# Patient Record
Sex: Male | Born: 1980 | Race: Black or African American | Hispanic: No | Marital: Single | Smoking: Current some day smoker
Health system: Southern US, Community
[De-identification: ages and names within clinical notes are randomized; demographics above are authoritative.]

---

## 2005-12-08 ENCOUNTER — Emergency Department (HOSPITAL_COMMUNITY): Admission: EM | Admit: 2005-12-08 | Discharge: 2005-12-08 | Payer: Self-pay | Admitting: Emergency Medicine

## 2007-04-20 ENCOUNTER — Emergency Department (HOSPITAL_COMMUNITY): Admission: EM | Admit: 2007-04-20 | Discharge: 2007-04-20 | Payer: Self-pay | Admitting: Emergency Medicine

## 2007-04-29 ENCOUNTER — Emergency Department (HOSPITAL_COMMUNITY): Admission: EM | Admit: 2007-04-29 | Discharge: 2007-04-29 | Payer: Self-pay | Admitting: Emergency Medicine

## 2009-05-29 ENCOUNTER — Emergency Department (HOSPITAL_COMMUNITY): Admission: EM | Admit: 2009-05-29 | Discharge: 2009-05-29 | Payer: Self-pay | Admitting: Emergency Medicine

## 2015-06-11 ENCOUNTER — Encounter (HOSPITAL_COMMUNITY): Payer: Self-pay | Admitting: *Deleted

## 2015-06-11 ENCOUNTER — Emergency Department (HOSPITAL_COMMUNITY)
Admission: EM | Admit: 2015-06-11 | Discharge: 2015-06-11 | Disposition: A | Discharge status: Home / Self Care | Payer: BLUE CROSS/BLUE SHIELD | Attending: Emergency Medicine | Admitting: Emergency Medicine

## 2015-06-11 ENCOUNTER — Emergency Department (HOSPITAL_COMMUNITY): Payer: BLUE CROSS/BLUE SHIELD

## 2015-06-11 DIAGNOSIS — R519 Headache, unspecified: Secondary | ICD-10-CM

## 2015-06-11 DIAGNOSIS — H538 Other visual disturbances: Secondary | ICD-10-CM | POA: Diagnosis not present

## 2015-06-11 DIAGNOSIS — R51 Headache: Secondary | ICD-10-CM | POA: Diagnosis not present

## 2015-06-11 DIAGNOSIS — F17200 Nicotine dependence, unspecified, uncomplicated: Secondary | ICD-10-CM | POA: Diagnosis not present

## 2015-06-11 MED ORDER — DEXAMETHASONE SODIUM PHOSPHATE 10 MG/ML IJ SOLN
10.0000 mg | Freq: Once | INTRAMUSCULAR | Status: AC
  Administered 2015-06-11: 10 mg via INTRAVENOUS
  Filled 2015-06-11: qty 1

## 2015-06-11 MED ORDER — DIPHENHYDRAMINE HCL 50 MG/ML IJ SOLN
25.0000 mg | Freq: Once | INTRAMUSCULAR | Status: AC
  Administered 2015-06-11: 25 mg via INTRAVENOUS
  Filled 2015-06-11: qty 1

## 2015-06-11 MED ORDER — PREDNISONE 20 MG PO TABS: ORAL_TABLET | ORAL | Status: DC

## 2015-06-11 MED ORDER — METOCLOPRAMIDE HCL 5 MG/ML IJ SOLN
10.0000 mg | Freq: Once | INTRAMUSCULAR | Status: AC
  Administered 2015-06-11: 10 mg via INTRAVENOUS
  Filled 2015-06-11: qty 2

## 2015-06-11 MED ORDER — SODIUM CHLORIDE 0.9 % IV BOLUS (SEPSIS)
1000.0000 mL | Freq: Once | INTRAVENOUS | Status: AC
  Administered 2015-06-11: 1000 mL via INTRAVENOUS

## 2015-06-11 NOTE — Discharge Instructions (Signed)
Use ice packs over the area for comfort. Take the prednisone until gone. Keep your appointment with Dr Karilyn CotaGosrani on the 17th. He may want to do other studies to evaluate your headache. Return to the ED if you get a constant headache that is lasting for over an hour, or if you get numbness or tingling in your extremities, vomiting.

## 2015-06-11 NOTE — ED Notes (Signed)
Pt given ice pack,

## 2015-06-11 NOTE — ED Notes (Signed)
Pt updated, states that the headache is " a little better"

## 2015-06-11 NOTE — ED Notes (Signed)
Dr Knapp at bedside,  

## 2015-06-11 NOTE — ED Notes (Signed)
Pt updated on delay,  

## 2015-06-11 NOTE — ED Notes (Signed)
Pt c/o intermittent headache located at left temporal region that lasts about 5 seconds each time and when the headache goes away his vision will be blurry for about 5 seconds as well then return normal,

## 2015-06-11 NOTE — ED Provider Notes (Signed)
CSN: [id number]     Arrival date & time 06/11/15  0058 History   First MD Initiated Contact with Patient 06/11/15 0258   Chief Complaint  Patient presents with  . Headache     (Consider location/radiation/quality/duration/timing/severity/associated sxs/prior Treatment) HPI patient states about 4 days ago he started getting a pain in his left temple that he describes as sharp and pressure and afterwards he has blurred vision in his left eye in his left eye tears. He states the pain only last about 5 seconds. He states he was only getting it about once a day however today he's had about 10-12 episodes and it got worse after he went to work. He states nothing he does makes it come on or makes it worse, nothing he does makes it go away. He states he's even had episodes while waiting in the ED to be seen. He denies any numbness or tingling in his extremities. He states he's never had this before. He does describe he had a tick on his right buttock about 2 days before the symptoms started. He states his mother had an intracranial aneurysm, and his sister has seizures and has headaches after her seizures otherwise family history is negative.   PCP Dr Karilyn Cota  First appt on May 17th  History reviewed. No pertinent past medical history. History reviewed. No pertinent past surgical history. No family history on file. Social History  Substance Use Topics  . Smoking status: Current Some Day Smoker  . Smokeless tobacco: None  . Alcohol Use: No  employed Smokes 1/2 ppd  Review of Systems  All other systems reviewed and are negative.     Allergies  Review of patient's allergies indicates no known allergies.  Home Medications   Prior to Admission medications   Medication Sig Start Date End Date Taking? Authorizing Provider  predniSONE (DELTASONE) 20 MG tablet Take 3 po QD x 3d , then 2 po QD x 3d then 1 po QD x 3d 06/11/15   Devoria Albe, MD   BP 126/81 mmHg  Pulse 77  Temp(Src) 97.9 F (36.6  C) (Oral)  Resp 20  Ht  (1.854 m)  Wt 250 lb (113.399 kg)  BMI 32.99 kg/m2  SpO2 99%  Vital signs normal   Physical Exam  Constitutional: He is oriented to person, place, and time. He appears well-developed and well-nourished.  Non-toxic appearance. He does not appear ill. No distress.  HENT:  Head: Normocephalic and atraumatic.  Right Ear: External ear normal.  Left Ear: External ear normal.  Nose: Nose normal. No mucosal edema or rhinorrhea.  Mouth/Throat: Oropharynx is clear and moist and mucous membranes are normal. No dental abscesses or uvula swelling.  No tenderness over the temporal artery.  Eyes: Conjunctivae and EOM are normal. Pupils are equal, round, and reactive to light.  Neck: Normal range of motion and full passive range of motion without pain. Neck supple.  Cardiovascular: Normal rate, regular rhythm and normal heart sounds.  Exam reveals no gallop and no friction rub.   No murmur heard. Pulmonary/Chest: Effort normal and breath sounds normal. No respiratory distress. He has no wheezes. He has no rhonchi. He has no rales. He exhibits no tenderness and no crepitus.  Abdominal: Soft. Normal appearance and bowel sounds are normal. He exhibits no distension. There is no tenderness. There is no rebound and no guarding.  Musculoskeletal: Normal range of motion. He exhibits no edema or tenderness.  Moves all extremities well.   Neurological: He  is alert and oriented to person, place, and time. He has normal strength. No cranial nerve deficit.  Skin: Skin is warm, dry and intact. No rash noted. No erythema. No pallor.  Psychiatric: He has a normal mood and affect. His speech is normal and behavior is normal. His mood appears not anxious.  Nursing note and vitals reviewed.   ED Course  Procedures (including critical care time)  Medications  sodium chloride 0.9 % bolus 1,000 mL (0 mLs Intravenous Stopped 06/11/15 0512)  metoCLOPramide (REGLAN) injection 10 mg (10 mg  Intravenous Given 06/11/15 0327)  diphenhydrAMINE (BENADRYL) injection 25 mg (25 mg Intravenous Given 06/11/15 0327)  dexamethasone (DECADRON) injection 10 mg (10 mg Intravenous Given 06/11/15 0515)    Patient was given IV Reglan and Benadryl for his atypical headache pain.  Patient was rechecked at 4:30. He states he had been sleeping. He does not think he's had anymore episodes. I told him would watch a little bit longer and make sure they're gone. He was given the results of his CT scan. We discussed he needs to discuss these headaches with his primary care Dr. Kathrynn RunningManning has his first appointment on the 17th. They may want to get a MRI to pursue the history of an aneurysm in his mother. Unfortunately there is no MRI available here on night shift or for the weekend.  Recheck at 5 AM he states he had another episode right after I left the room. He was given IV Decadron.   5:40 AM patient states he had another episode. He had ice pack placed on his temple.  Patient rechecked at 6:15, he has had no other episodes. This point he was felt to be stable for discharge. He has an appointment with his PCP for the first time in a couple days. He could have him decide if he needs any more testing done if he continues to have symptoms.  Imaging Review Ct Head Wo Contrast  06/11/2015  CLINICAL DATA:  Intermittent brief episodes of LEFT temporal headache and vision changes. Family history of aneurysm. EXAM: CT HEAD WITHOUT CONTRAST TECHNIQUE: Contiguous axial images were obtained from the base of the skull through the vertex without intravenous contrast. COMPARISON:  None. FINDINGS: INTRACRANIAL CONTENTS: The ventricles and sulci are normal. No intraparenchymal hemorrhage, mass effect nor midline shift. No acute large vascular territory infarcts. No abnormal extra-axial fluid collections. Basal cisterns are patent. ORBITS: The included ocular globes and orbital contents are normal. SINUSES: Mild paranasal sinus  mucosal thickening. The mastoid air cells are well aerated. SKULL/SOFT TISSUES: No skull fracture. No significant soft tissue swelling. IMPRESSION: Normal CT HEAD. Electronically Signed   By: Awilda Metroourtnay  Bloomer M.D.   On: 06/11/2015 04:08   I have personally reviewed and evaluated these images and lab results as part of my medical decision-making.    MDM   patient presents with an atypical headache that is in his left temple with some watering of his left eye, it may be a type of cluster headache, doubtful for temporal arteritis because of his age and his temporal artery is not tender to palpation. He has a family history of intracranial aneurysm in his mother, CT of the head without contrast was normal. His symptoms improved with ice to the area. He is to follow-up with his primary care physician this week. He is to return to the ED if he gets a constant pain that lasts more than the 5 seconds he's been having now or if he gets numbness  and tingling of his extremities or uncontrolled vomiting.    Final diagnoses:  Left temporal headache    meds OTC ibuprofen or naproxen  Plan discharge  Devoria Albe, MD, Concha Pyo, MD 06/11/15 [phone]

## 2015-06-15 ENCOUNTER — Other Ambulatory Visit (HOSPITAL_COMMUNITY): Payer: Self-pay | Admitting: Internal Medicine

## 2015-06-15 DIAGNOSIS — R519 Headache, unspecified: Secondary | ICD-10-CM

## 2015-06-15 DIAGNOSIS — R51 Headache: Principal | ICD-10-CM

## 2015-06-17 ENCOUNTER — Ambulatory Visit (HOSPITAL_COMMUNITY)
Admission: RE | Admit: 2015-06-17 | Discharge: 2015-06-17 | Disposition: A | Discharge status: Home / Self Care | Payer: BLUE CROSS/BLUE SHIELD | Source: Ambulatory Visit | Attending: Radiology | Admitting: Radiology

## 2015-06-17 ENCOUNTER — Ambulatory Visit (HOSPITAL_COMMUNITY)
Admission: RE | Admit: 2015-06-17 | Discharge: 2015-06-17 | Disposition: A | Discharge status: Home / Self Care | Payer: BLUE CROSS/BLUE SHIELD | Source: Ambulatory Visit | Attending: Internal Medicine | Admitting: Internal Medicine

## 2015-06-17 ENCOUNTER — Other Ambulatory Visit (HOSPITAL_COMMUNITY): Payer: Self-pay | Admitting: Radiology

## 2015-06-17 DIAGNOSIS — G441 Vascular headache, not elsewhere classified: Secondary | ICD-10-CM | POA: Insufficient documentation

## 2015-06-17 DIAGNOSIS — J320 Chronic maxillary sinusitis: Secondary | ICD-10-CM | POA: Insufficient documentation

## 2015-06-17 DIAGNOSIS — R51 Headache: Secondary | ICD-10-CM

## 2015-06-17 DIAGNOSIS — M795 Residual foreign body in soft tissue: Secondary | ICD-10-CM

## 2015-06-17 DIAGNOSIS — R519 Headache, unspecified: Secondary | ICD-10-CM

## 2017-03-10 IMAGING — MR MR HEAD W/O CM
6 of 10 series · 21 of 48 positions shown · non-contrast
Comparison: None.

CLINICAL DATA: Left-sided headache for 1 week. Unspecified headache
type.

EXAM:
MRI HEAD WITHOUT CONTRAST
TECHNIQUE: Multiplanar, multiecho pulse sequences of the brain and surrounding
structures were obtained without intravenous contrast.

[Series 2: T1 · sagittal · 5.0mm · 0.41mm/px · 2 of 21 slices shown (1 of 2)]
[im 1/21]
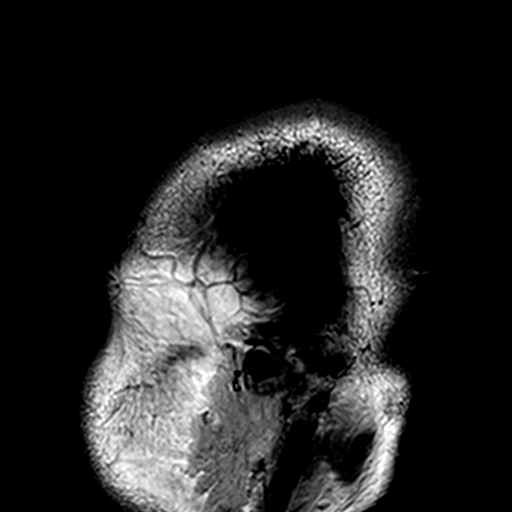
[im 21/21]
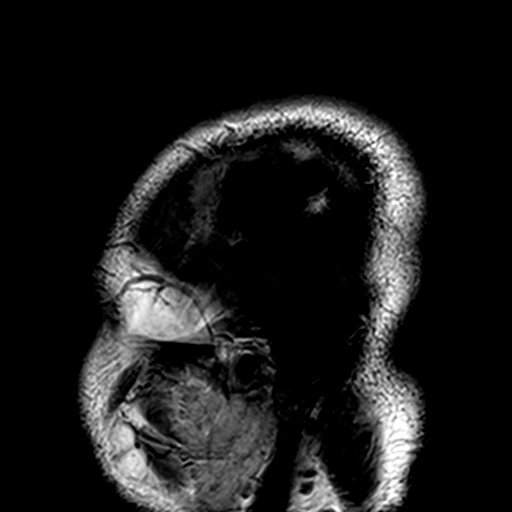

[Series 5: T2 · axial · 5.0mm · 0.49mm/px · z∈[-33,+117]mm · 3 of 24 slices shown (1 of 2)]
[im 1/24]
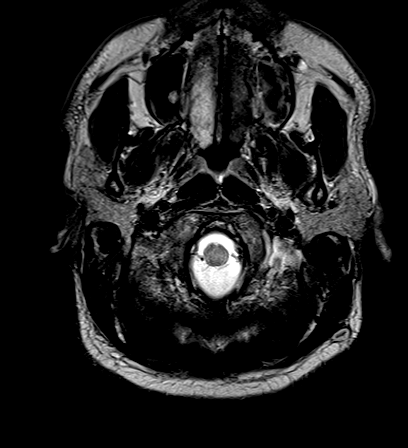
[im 12/24]
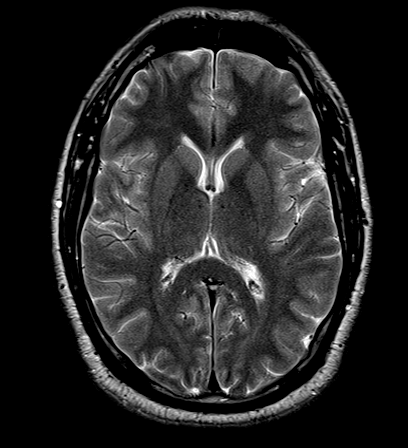
[im 24/24]
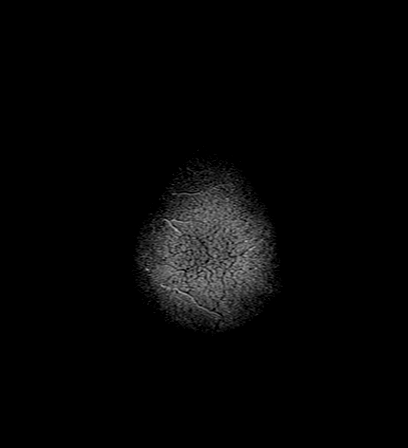

[Series 6: FLAIR · axial · 5.0mm · 0.35mm/px · z∈[-32,+117]mm · 3 of 24 slices shown]
[im 1/24]
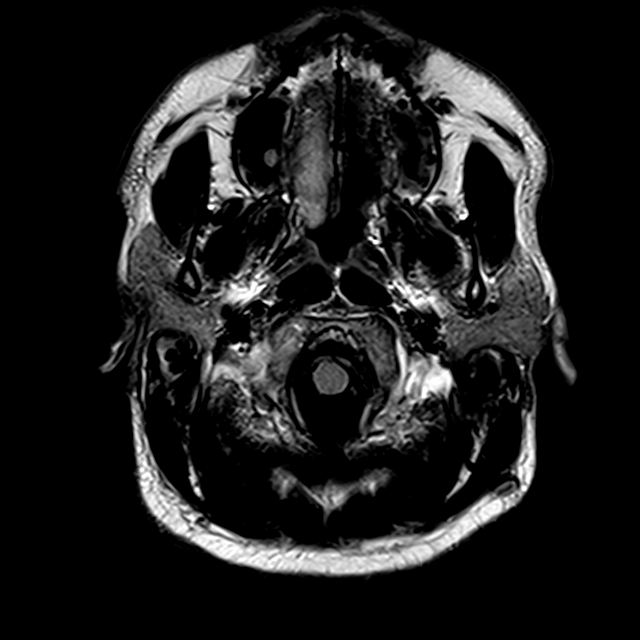
[im 12/24]
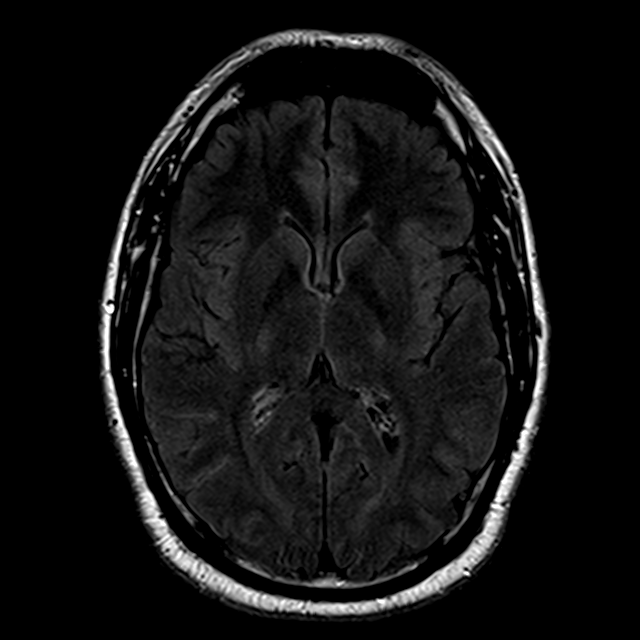
[im 24/24]
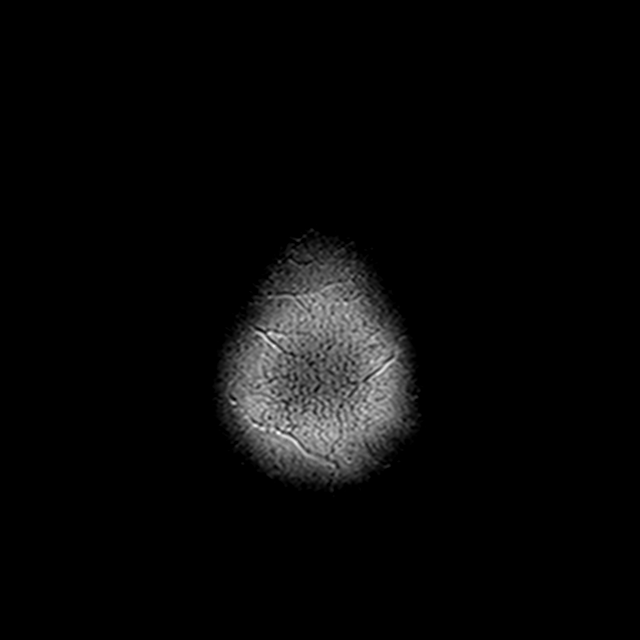

[Series 7: T1 · axial · 2.0mm · 0.43mm/px · z∈[-43,+123]mm · 8 of 84 slices shown (2 of 2)]
[im 1/84]
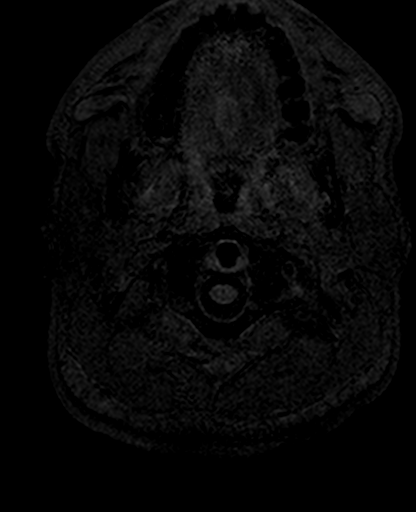
[im 10/84]
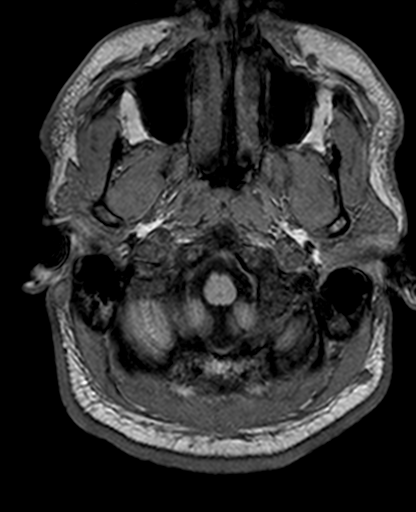
[im 28/84]
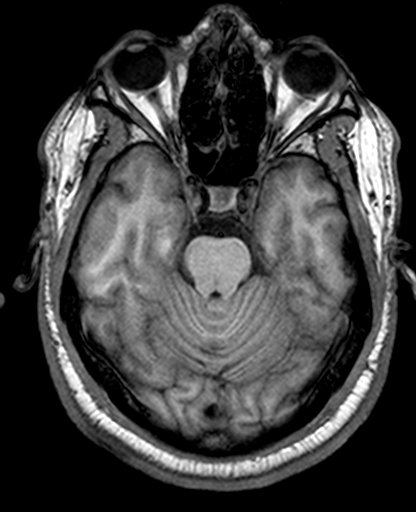
[im 37/84]
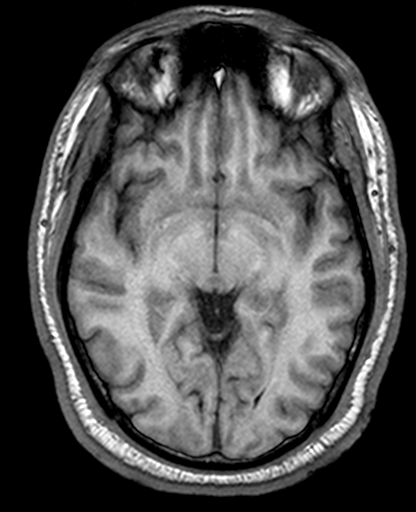
[im 47/84]
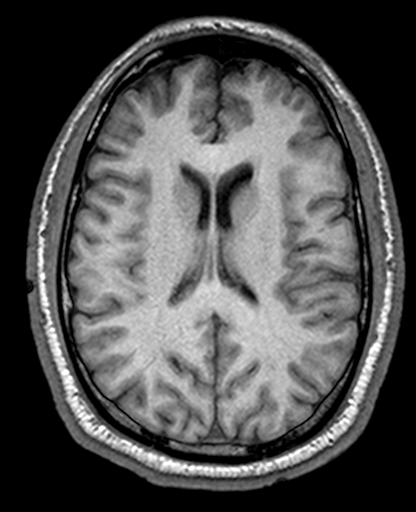
[im 56/84]
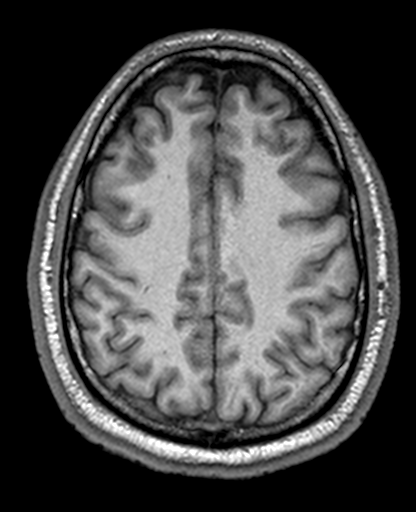
[im 74/84]
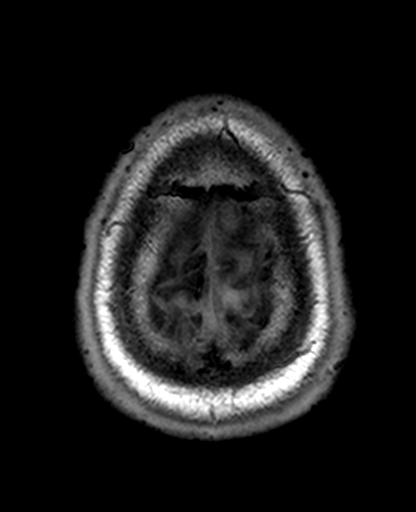
[im 84/84]
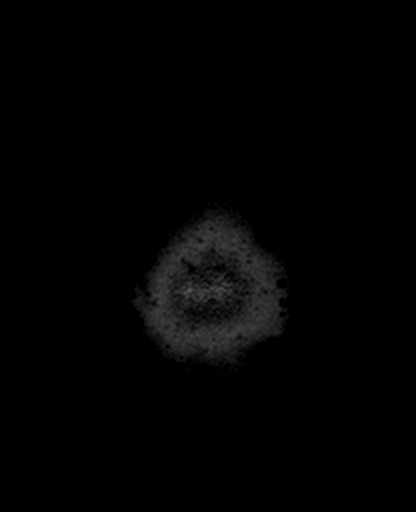

[Series 8: trauma axial · axial · 5.0mm · 0.42mm/px · z∈[-33,+39]mm · 2 of 24 slices shown]
[im 1/24]
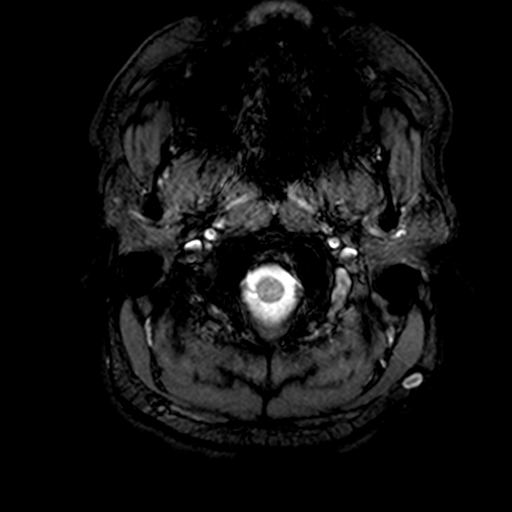
[im 12/24]
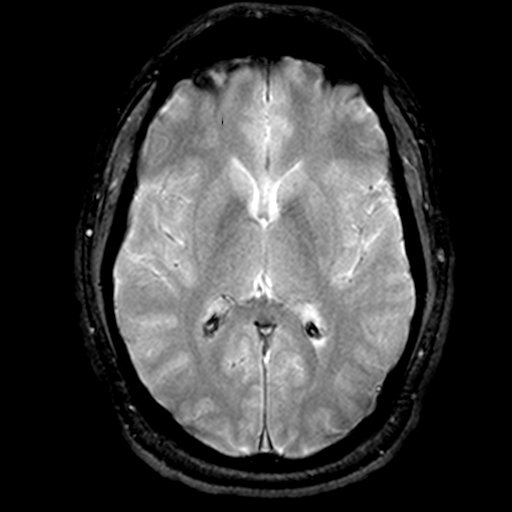

[Series 9: T2 · coronal · 5.0mm · 0.42mm/px · 3 of 26 slices shown (2 of 2)]
[im 1/26]
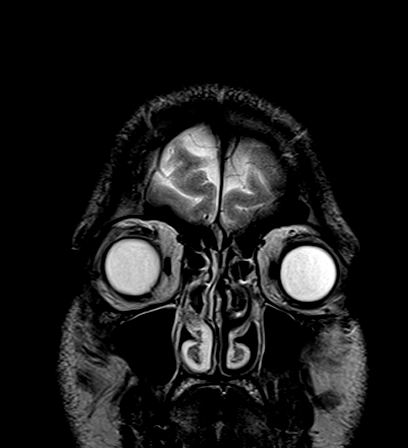
[im 13/26]
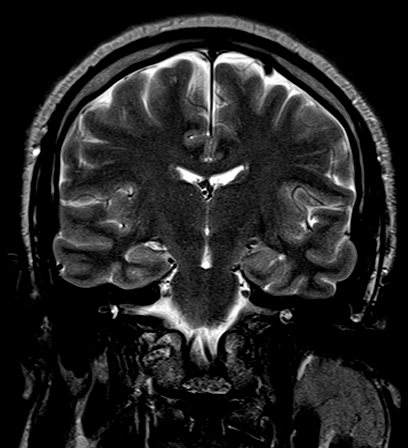
[im 26/26]
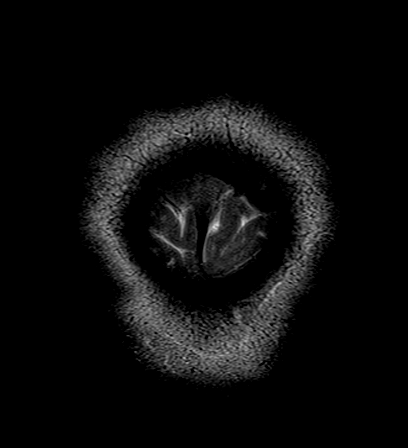

[21 of 48 positions shown; findings below may reference images not displayed]

FINDINGS: Diffusion-weighted images demonstrate no evidence for acute or
subacute infarction. No acute hemorrhage or mass lesion is present.
No significant white matter disease is present.

The ventricles are of normal size. No significant extraaxial fluid
collection is present.

The internal auditory canals are within normal limits bilaterally.
Flow is present in the major intracranial arteries. The globes and
orbits are intact.

The skullbase is within normal limits. Midline sagittal images are
unremarkable.

Mild mucosal thickening is evident in the maxillary sinuses, worse
on the left. There is mucosal thickening through the ostiomeatal
complex. No fluid levels are present. The globes and orbits are
intact.
IMPRESSION: 1. Normal MRI appearance of the brain.
2. Mild maxillary sinus disease.

## 2019-04-12 ENCOUNTER — Encounter (HOSPITAL_COMMUNITY): Payer: Self-pay

## 2019-04-12 ENCOUNTER — Other Ambulatory Visit: Payer: Self-pay

## 2019-04-12 ENCOUNTER — Emergency Department (HOSPITAL_COMMUNITY)
Admission: EM | Admit: 2019-04-12 | Discharge: 2019-04-12 | Disposition: A | Discharge status: Home / Self Care | Payer: BC Managed Care – PPO | Attending: Emergency Medicine | Admitting: Emergency Medicine

## 2019-04-12 DIAGNOSIS — F17200 Nicotine dependence, unspecified, uncomplicated: Secondary | ICD-10-CM | POA: Insufficient documentation

## 2019-04-12 DIAGNOSIS — R1013 Epigastric pain: Secondary | ICD-10-CM | POA: Diagnosis present

## 2019-04-12 DIAGNOSIS — R109 Unspecified abdominal pain: Secondary | ICD-10-CM

## 2019-04-12 LAB — COMPREHENSIVE METABOLIC PANEL
ALT: 34 U/L (ref 0–44)
AST: 25 U/L (ref 15–41)
Albumin: 4.3 g/dL (ref 3.5–5.0)
Alkaline Phosphatase: 87 U/L (ref 38–126)
Anion gap: 7 (ref 5–15)
BUN: 9 mg/dL (ref 6–20)
CO2: 25 mmol/L (ref 22–32)
Calcium: 9.5 mg/dL (ref 8.9–10.3)
Chloride: 106 mmol/L (ref 98–111)
Creatinine, Ser: 1.16 mg/dL (ref 0.61–1.24)
GFR calc Af Amer: >60 mL/min (ref >60)
GFR calc non Af Amer: >60 mL/min (ref >60)
Glucose, Bld: 98 mg/dL (ref 70–99)
Potassium: 4.4 mmol/L (ref 3.5–5.1)
Sodium: 138 mmol/L (ref 135–145)
Total Bilirubin: 0.6 mg/dL (ref 0.3–1.2)
Total Protein: 6.9 g/dL (ref 6.5–8.1)

## 2019-04-12 LAB — CBC WITH DIFFERENTIAL/PLATELET
Abs Immature Granulocytes: 0.02 10*3/uL (ref 0.00–0.07)
Basophils Absolute: 0 10*3/uL (ref 0.0–0.1)
Basophils Relative: 0 %
Eosinophils Absolute: 0.1 10*3/uL (ref 0.0–0.5)
Eosinophils Relative: 2 %
HCT: 46.5 % (ref 39.0–52.0)
Hemoglobin: 15.5 g/dL (ref 13.0–17.0)
Immature Granulocytes: 0 %
Lymphocytes Relative: 28 %
Lymphs Abs: 2.1 10*3/uL (ref 0.7–4.0)
MCH: 31 pg (ref 26.0–34.0)
MCHC: 33.3 g/dL (ref 30.0–36.0)
MCV: 93 fL (ref 80.0–100.0)
Monocytes Absolute: 0.5 10*3/uL (ref 0.1–1.0)
Monocytes Relative: 6 %
Neutro Abs: 4.7 10*3/uL (ref 1.7–7.7)
Neutrophils Relative %: 64 %
Platelets: 278 10*3/uL (ref 150–400)
RBC: 5 MIL/uL (ref 4.22–5.81)
RDW: 13.3 % (ref 11.5–15.5)
WBC: 7.5 10*3/uL (ref 4.0–10.5)
nRBC: 0 % (ref 0.0–0.2)

## 2019-04-12 LAB — LIPASE, BLOOD: Lipase: 25 U/L (ref 11–51)

## 2019-04-12 MED ORDER — ALUM & MAG HYDROXIDE-SIMETH 200-200-20 MG/5ML PO SUSP
30.0000 mL | Freq: Once | ORAL | Status: AC
  Administered 2019-04-12: 30 mL via ORAL
  Filled 2019-04-12: qty 30

## 2019-04-12 MED ORDER — OMEPRAZOLE 20 MG PO CPDR: 40.0000 mg | DELAYED_RELEASE_CAPSULE | Freq: Every day | ORAL | 0 refills | Status: DC

## 2019-04-12 MED ORDER — LOPERAMIDE HCL 2 MG PO CAPS: 2.0000 mg | ORAL_CAPSULE | Freq: Four times a day (QID) | ORAL | 0 refills | Status: DC | PRN

## 2019-04-12 NOTE — ED Notes (Signed)
Paul Floyd mother to be updated at [phone].

## 2019-04-12 NOTE — Discharge Instructions (Addendum)
You need to get established with a primary care provider for ongoing evaluation and management of her health and wellbeing.  I have also put in a referral for you to contact gastroenterology should your symptoms fail to improve with conservative therapy.  Please take your omeprazole, as prescribed.  Please take Imodium as needed for loose stools.  I also recommend over-the-counter Gas-X as well as Maalox.  Please be sure to drink plenty of fluids to avoid dehydration.  Return to the ED or seek immediate medical attention for any new or worsening symptoms.

## 2019-04-12 NOTE — ED Provider Notes (Addendum)
[facility] EMERGENCY DEPARTMENT Provider Note   CSN: [id number] Arrival date & time: 04/12/19  1318     History Chief Complaint  Patient presents with  . Gastroesophageal Reflux    Paul Floyd is a 39 y.o. male with no significant PMH who presents the ED with a 6-hour history of constant, unrelenting, intermittently 10 out of 10 epigastric abdominal pain.  Patient reports that he has had the symptoms in the past and that they typically resolve with antacids.  However, he is consumed Pepto-Bismol and Tums, with no improvement.  He describes the pain as a "twisting" pain in epigastrium.  Does not radiate to the back or elsewhere.  He also endorses diminished appetite and has not been eating or drinking today.  Denies any fevers or chills, chest pain palpitations, shortness of breath, cough, nausea or vomiting, urinary symptoms, or alcohol use.  He denies any recent NSAID use.  He had 1 loose bowel movement this morning, however it was brown and he denies any hematochezia or melena.  HPI     History reviewed. No pertinent past medical history.  There are no problems to display for this patient.   History reviewed. No pertinent surgical history.     No family history on file.  Social History   Tobacco Use  . Smoking status: Current Some Day Smoker  Substance Use Topics  . Alcohol use: No  . Drug use: No    Home Medications Prior to Admission medications   Medication Sig Start Date End Date Taking? Authorizing Provider  loperamide (IMODIUM) 2 MG capsule Take 1 capsule (2 mg total) by mouth 4 (four) times daily as needed for diarrhea or loose stools. 04/12/19   Lorelee New, PA-C  omeprazole (PRILOSEC) 20 MG capsule Take 2 capsules (40 mg total) by mouth daily. 04/12/19 05/12/19  Lorelee New, PA-C  predniSONE (DELTASONE) 20 MG tablet Take 3 po QD x 3d , then 2 po QD x 3d then 1 po QD x 3d Patient not taking: Reported on 04/12/2019 06/11/15   Devoria Albe,  MD    Allergies    Patient has no known allergies.  Review of Systems   Review of Systems  Constitutional: Negative for chills and fever.  Respiratory: Negative for shortness of breath.   Cardiovascular: Negative for chest pain and palpitations.  Gastrointestinal: Positive for abdominal pain. Negative for blood in stool, nausea and vomiting.    Physical Exam Updated Vital Signs BP 120/89 (BP Location: Left Arm)   Pulse 82   Temp 98.4 F (36.9 C) (Oral)   Resp 18   Ht 6\' 1"  (1.854 m)   Wt 125.2 kg   SpO2 97%   BMI 36.41 kg/m   Physical Exam Vitals and nursing note reviewed. Exam conducted with a chaperone present.  Constitutional:      Appearance: Normal appearance.  HENT:     Head: Normocephalic and atraumatic.  Eyes:     General: No scleral icterus.    Conjunctiva/sclera: Conjunctivae normal.  Cardiovascular:     Rate and Rhythm: Normal rate and regular rhythm.     Pulses: Normal pulses.     Heart sounds: Normal heart sounds.  Pulmonary:     Effort: Pulmonary effort is normal. No respiratory distress.     Breath sounds: Normal breath sounds.  Abdominal:     Comments: Soft, nondistended.  Mildly TTP in epigastrium, but no TTP elsewhere.  No overlying skin changes.  Normoactive bowel  sounds.  Skin:    General: Skin is dry.     Capillary Refill: Capillary refill takes less than 2 seconds.  Neurological:     General: No focal deficit present.     Mental Status: He is alert.     GCS: GCS eye subscore is 4. GCS verbal subscore is 5. GCS motor subscore is 6.  Psychiatric:        Mood and Affect: Mood normal.        Behavior: Behavior normal.        Thought Content: Thought content normal.      ED Results / Procedures / Treatments   Labs (all labs ordered are listed, but only abnormal results are displayed) Labs Reviewed  CBC WITH DIFFERENTIAL/PLATELET  COMPREHENSIVE METABOLIC PANEL  LIPASE, BLOOD    EKG EKG Interpretation  Date/Time:  Sunday April 12 2019 14:57:13 EDT Ventricular Rate:  70 PR Interval:  160 QRS Duration: 90 QT Interval:  360 QTC Calculation: 388 R Axis:   36 Text Interpretation: Normal sinus rhythm with sinus arrhythmia Normal ECG since last tracing no significant change Confirmed by Miller, Brian ([id number]) on 04/12/2019 3:29:41 PM   Radiology No results found.  Procedures Procedures (including critical care time)  Medications Ordered in ED Medications  alum & mag hydroxide-simeth (MAALOX/MYLANTA) 200-200-20 MG/5ML suspension 30 mL (30 mLs Oral Given 04/12/19 1453)    ED Course  I have reviewed the triage vital signs and the nursing notes.  Pertinent labs & imaging results that were available during my care of the patient were reviewed by me and considered in my medical decision making (see chart for details).  Clinical Course as of Apr 14 1652  Sun Apr 12, 2019  1623 Pulse Rate: 82 [GG]    Clinical Course User Index [GG] Cable Fearn L, PA-C   MDM Rules/Calculators/A&P                      Patient had another large, loose nonbloody, nonmelanotic stool while here in the ED.  He is also endorsing epigastric abdominal cramping consistent with a possible gastroenteritis.  His lab work obtained was entirely unremarkable and his vital signs are all within normal months.  Suspect that he may simply require fluid hydration and time.  While he does not have significant risk factors for PUD as he denies alcohol or NSAID use, he does endorse tobacco use.  Will treat with omeprazole and have patient follow-up with gastroenterology should his symptoms fail to improve.  Reassessed his abdomen and again only had mild TTP in epigastrium and no TTP elsewhere.  Do not feel as though CT imaging is warranted at this time.  Strict ED return precautions discussed with patient and his girlfriend who is a nurse in internal medicine.  All of the evaluation and work-up results were discussed with the patient and any family at bedside.  They were provided opportunity to ask any additional questions and have none at this time. They have expressed understanding of verbal discharge instructions as well as return precautions and are agreeable to the plan.    Final Clinical Impression(s) / ED Diagnoses Final diagnoses:  Stomach cramps    Rx / DC Orders ED Discharge Orders         Ordered    loperamide (IMODIUM) 2 MG capsule  4 times daily PRN     03 /14/21 1638    omeprazole (PRILOSEC) 20 MG capsule  Daily  04/12/19 1638           Lorelee New, PA-C 04/12/19 1639    Lorelee New, PA-C 04/15/19 1654    Eber Hong, MD 04/16/19 [phone]

## 2019-04-12 NOTE — ED Triage Notes (Signed)
Patient complains of intermittent reflux a few times a month that is normally relieved with Pepto. This am ongoing reflux and no relief with pepto

## 2019-05-20 ENCOUNTER — Ambulatory Visit (INDEPENDENT_AMBULATORY_CARE_PROVIDER_SITE_OTHER): Payer: BC Managed Care – PPO | Admitting: Internal Medicine

## 2019-06-22 ENCOUNTER — Ambulatory Visit (INDEPENDENT_AMBULATORY_CARE_PROVIDER_SITE_OTHER): Payer: BC Managed Care – PPO | Admitting: Nurse Practitioner

## 2019-06-26 ENCOUNTER — Ambulatory Visit (INDEPENDENT_AMBULATORY_CARE_PROVIDER_SITE_OTHER): Payer: BC Managed Care – PPO

## 2019-06-26 DIAGNOSIS — Z23 Encounter for immunization: Secondary | ICD-10-CM | POA: Diagnosis not present

## 2019-06-26 NOTE — Progress Notes (Signed)
   Covid-19 Vaccination Clinic  Name:  Paul Floyd    MRN: [id number] DOB: 11-Apr-1980  06/26/2019  [name] was observed post Covid-19 immunization for 30 minutes based on pre-vaccination screening With forearm tingling problem resolved. He was provided with Vaccine Information Sheet and instruction to access the V-Safe system.   [name] was instructed to call 911 with any severe reactions post vaccine: Marland Kitchen Difficulty breathing  . Swelling of face and throat  . A fast heartbeat  . A bad rash all over body  . Dizziness and weakness   Immunizations Administered    Name Date Dose VIS Date Route   Moderna COVID-19 Vaccine 06/26/2019 10:58 AM 0.5 mL 12/2018 Intramuscular   Manufacturer: Moderna   Lot: [id number]   NDC: 17919-957-90

## 2019-07-24 ENCOUNTER — Ambulatory Visit (INDEPENDENT_AMBULATORY_CARE_PROVIDER_SITE_OTHER): Payer: BC Managed Care – PPO

## 2019-07-24 ENCOUNTER — Ambulatory Visit: Payer: BC Managed Care – PPO | Attending: Internal Medicine

## 2019-07-24 ENCOUNTER — Ambulatory Visit: Payer: BC Managed Care – PPO

## 2019-07-24 DIAGNOSIS — Z23 Encounter for immunization: Secondary | ICD-10-CM

## 2019-07-24 NOTE — Progress Notes (Signed)
   Covid-19 Vaccination Clinic  Name:  BEAUREGARD JARRELLS    MRN: [id number] DOB: [birth year]  07/24/2019  Mr. Going was observed post Covid-19 immunization for 15 minutes without incident. He was provided with Vaccine Information Sheet and instruction to access the V-Safe system.   Mr. Halt was instructed to call 911 with any severe reactions post vaccine: Marland Kitchen Difficulty breathing  . Swelling of face and throat  . A fast heartbeat  . A bad rash all over body  . Dizziness and weakness   Immunizations Administered    Name Date Dose VIS Date Route   Moderna COVID-19 Vaccine 07/24/2019 11:07 AM 0.5 mL 12/2018 Intramuscular   Manufacturer: Moderna   Lot: [id number]   NDC: 01751-025-85

## 2019-10-13 ENCOUNTER — Emergency Department (HOSPITAL_COMMUNITY)
Admission: EM | Admit: 2019-10-13 | Discharge: 2019-10-13 | Discharge status: Against Medical Advice | Payer: BC Managed Care – PPO

## 2019-10-13 ENCOUNTER — Other Ambulatory Visit: Payer: Self-pay

## 2019-10-13 NOTE — ED Triage Notes (Signed)
Left prior to triage 

## 2020-02-24 ENCOUNTER — Encounter: Payer: Self-pay | Admitting: Gastroenterology

## 2020-03-30 ENCOUNTER — Other Ambulatory Visit (INDEPENDENT_AMBULATORY_CARE_PROVIDER_SITE_OTHER): Payer: BC Managed Care – PPO

## 2020-03-30 ENCOUNTER — Ambulatory Visit: Payer: BC Managed Care – PPO | Admitting: Gastroenterology

## 2020-03-30 ENCOUNTER — Encounter: Payer: Self-pay | Admitting: Gastroenterology

## 2020-03-30 VITALS — BP 110/80 | HR 75 | Ht 73.0 in | Wt 270.0 lb

## 2020-03-30 DIAGNOSIS — R1013 Epigastric pain: Secondary | ICD-10-CM

## 2020-03-30 DIAGNOSIS — R142 Eructation: Secondary | ICD-10-CM

## 2020-03-30 DIAGNOSIS — K219 Gastro-esophageal reflux disease without esophagitis: Secondary | ICD-10-CM

## 2020-03-30 DIAGNOSIS — R112 Nausea with vomiting, unspecified: Secondary | ICD-10-CM

## 2020-03-30 DIAGNOSIS — R195 Other fecal abnormalities: Secondary | ICD-10-CM

## 2020-03-30 DIAGNOSIS — R194 Change in bowel habit: Secondary | ICD-10-CM

## 2020-03-30 LAB — LIPASE: Lipase: 54 U/L (ref 11.0–59.0)

## 2020-03-30 LAB — COMPREHENSIVE METABOLIC PANEL
ALT: 45 U/L (ref 0–53)
AST: 27 U/L (ref 0–37)
Albumin: 4.3 g/dL (ref 3.5–5.2)
Alkaline Phosphatase: 84 U/L (ref 39–117)
BUN: 13 mg/dL (ref 6–23)
CO2: 24 mEq/L (ref 19–32)
Calcium: 9.4 mg/dL (ref 8.4–10.5)
Chloride: 104 mEq/L (ref 96–112)
Creatinine, Ser: 0.94 mg/dL (ref 0.40–1.50)
GFR: 102.11 mL/min (ref >60.00)
Glucose, Bld: 120 mg/dL — ABNORMAL HIGH (ref 70–99)
Potassium: 3.8 mEq/L (ref 3.5–5.1)
Sodium: 136 mEq/L (ref 135–145)
Total Bilirubin: 0.6 mg/dL (ref 0.2–1.2)
Total Protein: 7.4 g/dL (ref 6.0–8.3)

## 2020-03-30 LAB — CBC
HCT: 42.9 % (ref 39.0–52.0)
Hemoglobin: 14.9 g/dL (ref 13.0–17.0)
MCHC: 34.7 g/dL (ref 30.0–36.0)
MCV: 91.1 fl (ref 78.0–100.0)
Platelets: 266 10*3/uL (ref 150.0–400.0)
RBC: 4.71 Mil/uL (ref 4.22–5.81)
RDW: 13.8 % (ref 11.5–15.5)
WBC: 7.4 10*3/uL (ref 4.0–10.5)

## 2020-03-30 LAB — SEDIMENTATION RATE: Sed Rate: 13 mm/hr (ref 0–15)

## 2020-03-30 LAB — AMYLASE: Amylase: 78 U/L (ref 27–131)

## 2020-03-30 LAB — HIGH SENSITIVITY CRP: CRP, High Sensitivity: 2.42 mg/L (ref 0.000–5.000)

## 2020-03-30 MED ORDER — ESOMEPRAZOLE MAGNESIUM 40 MG PO PACK: 40.0000 mg | PACK | Freq: Every day | ORAL | 2 refills | Status: DC

## 2020-03-30 MED ORDER — ONDANSETRON 4 MG PO TBDP: 4.0000 mg | ORAL_TABLET | Freq: Three times a day (TID) | ORAL | 1 refills | Status: DC | PRN

## 2020-03-30 NOTE — Patient Instructions (Addendum)
You have been scheduled for an abdominal ultrasound at [facility] (1st floor of hospital) on 04/06/20 at 8:30am. Please arrive 15 minutes prior to your appointment for registration. Make certain not to have anything to eat or drink 6 hours prior to your appointment. Should you need to reschedule your appointment, please contact radiology at [phone]. This test typically takes about 30 minutes to perform.  You have been scheduled for an endoscopy. Please follow written instructions given to you at your visit today. If you use inhalers (even only as needed), please bring them with you on the day of your procedure.    If you are age 40 or younger, your body mass index should be between 19-25. Your Body mass index is 35.62 kg/m. If this is out of the aformentioned range listed, please consider follow up with your Primary Care Provider.   Due to recent changes in healthcare laws, you may see the results of your imaging and laboratory studies on MyChart before your provider has had a chance to review them.  We understand that in some cases there may be results that are confusing or concerning to you. Not all laboratory results come back in the same time frame and the provider may be waiting for multiple results in order to interpret others.  Please give [place] 48 hours in order for your provider to thoroughly review all the results before contacting the office for clarification of your results.   We have sent the following medications to your pharmacy for you to pick up at your convenience: Zofran, Nexium  Only take Nexium if you start having abdomen discomfort.   Your provider has requested that you go to the basement level for lab work before leaving today. Press "B" on the elevator. The lab is located at the first door on the left as you exit the elevator.   Thank you for choosing me and  Gastroenterology.  Dr. Meridee Score

## 2020-03-31 LAB — TISSUE TRANSGLUTAMINASE, IGA: (tTG) Ab, IgA: <1 U/mL

## 2020-03-31 LAB — IGA: Immunoglobulin A: 148 mg/dL (ref 47–310)

## 2020-03-31 LAB — TSH: TSH: 0.58 u[IU]/mL (ref 0.35–4.50)

## 2020-03-31 LAB — HEPATITIS C ANTIBODY
Hepatitis C Ab: NONREACTIVE
SIGNAL TO CUT-OFF: 0.64 (ref <1.00)

## 2020-04-04 MED ORDER — ESOMEPRAZOLE MAGNESIUM 40 MG PO CPDR: 40.0000 mg | DELAYED_RELEASE_CAPSULE | Freq: Every day | ORAL | 2 refills | Status: DC

## 2020-04-05 ENCOUNTER — Encounter: Payer: Self-pay | Admitting: Gastroenterology

## 2020-04-05 DIAGNOSIS — R1110 Vomiting, unspecified: Secondary | ICD-10-CM | POA: Insufficient documentation

## 2020-04-05 DIAGNOSIS — R1013 Epigastric pain: Secondary | ICD-10-CM | POA: Insufficient documentation

## 2020-04-05 DIAGNOSIS — R195 Other fecal abnormalities: Secondary | ICD-10-CM | POA: Insufficient documentation

## 2020-04-05 DIAGNOSIS — K219 Gastro-esophageal reflux disease without esophagitis: Secondary | ICD-10-CM | POA: Insufficient documentation

## 2020-04-05 DIAGNOSIS — R142 Eructation: Secondary | ICD-10-CM | POA: Insufficient documentation

## 2020-04-05 DIAGNOSIS — R194 Change in bowel habit: Secondary | ICD-10-CM | POA: Insufficient documentation

## 2020-04-05 NOTE — Progress Notes (Signed)
[facility] VISIT   Primary Care Provider Doree Albee, MD [place] [address] [phone]  Referring Provider Doree Albee, MD [address] [place],  [place] [address] [phone]  Patient Profile: Paul Floyd is a 40 y.o. male with a pmh significant for obesity, intermittent GERD.  The patient presents to the [facility] for an evaluation and management of problem(s) noted below:  Problem List 1. Abdominal pain, epigastric   2. Gastroesophageal reflux disease, unspecified whether esophagitis present   3. Belching symptom   4. Non-intractable vomiting with nausea, unspecified vomiting type   5. Change in bowel habits   6. Change in stool caliber     History of Present Illness This is the patient's first visit to the outpatient [facility].  The patient presents as a referral in the setting of having abdominal discomfort and changes in bowel habits.  The patient for years has had abdominal discomfort and belching that occurs in the region of the subxiphoid/midepigastrium.  This discomfort has been occurring just a few times a year but now has become more frequent in nature and occurring on an almost daily basis.  He describes having symptoms of pyrosis.  He has belching with a smell of rotten eggs as well as dysgeusia.  He denies any abdominal bloating or distention.  At the time of these episodes he will have a change in stool caliber as well as some mild diarrhea but otherwise has normal bowel movements.  There is been no blood in his stool (melena/hematochezia/maroon stools).  More recently he has had a few episodes where the discomfort has been in the right upper quadrant region.  He is trialed over-the-counter Tums as well as Prilosec as well as Pepto-Bismol but not for extended period times because most the time this pain and discomfort would pass within a day or so.  He has never taken a 2-week course of  PPI to sustain.  There is a familial history of cancer in his paternal grandmother but it is unclear if this is GI related or not.  He has significant episodes of nausea as a result of the rotten taste in his mouth and belching which can lead to vomiting.  He denies any coffee-ground emesis or hematemesis.  Patient has never had an extended work-up for this discomfort and is interested in moving forward with trying to find out what is causing his symptoms if possible.  He has no prior history of intra-abdominal surgeries.  The patient does not take significant nonsteroidals or BC/Goody powders.  The patient has never had an upper or lower endoscopy.  GI Review of Systems Positive as above Negative for dysphagia, odynophagia, decreased appetite, early satiety  Review of Systems General: Denies fevers/chills/weight loss unintentionally HEENT: Denies oral lesions Cardiovascular: Denies chest pain/palpitations Pulmonary: Denies shortness of breath/nocturnal cough Gastroenterological: See HPI Genitourinary: Denies darkened urine or hematuria Hematological: Denies easy bruising/bleeding Endocrine: Denies temperature intolerance Dermatological: Denies jaundice Psychological: Mood is stable   Medications Current Outpatient Medications  Medication Sig Dispense Refill   esomeprazole (NEXIUM) 40 MG capsule Take 1 capsule (40 mg total) by mouth daily. 30 capsule 2   ondansetron (ZOFRAN ODT) 4 MG disintegrating tablet Take 1 tablet (4 mg total) by mouth every 8 (eight) hours as needed for nausea or vomiting. 20 tablet 1   No current facility-administered medications for this visit.    Allergies No Known Allergies  Histories History reviewed. No  pertinent past medical history. History reviewed. No pertinent surgical history. Social History   Socioeconomic History   Marital status: Single    Spouse name: Not on file   Number of children: Not on file   Years of education: Not on file    Highest education level: Not on file  Occupational History   Not on file  Tobacco Use   Smoking status: Current Some Day Smoker    Types: Cigarettes   Smokeless tobacco: Never Used  Substance and Sexual Activity   Alcohol use: No   Drug use: No   Sexual activity: Not on file  Other Topics Concern   Not on file  Social History Narrative   Not on file   Social Determinants of Health   Financial Resource Strain: Not on file  Food Insecurity: Not on file  Transportation Needs: Not on file  Physical Activity: Not on file  Stress: Not on file  Social Connections: Not on file  Intimate Partner Violence: Not on file   Family History  Problem Relation Age of Onset   Cancer Paternal Grandmother    Breast cancer Paternal Aunt    Colon cancer Neg Hx    Esophageal cancer Neg Hx    Inflammatory bowel disease Neg Hx    Liver disease Neg Hx    Pancreatic cancer Neg Hx    Rectal cancer Neg Hx    Stomach cancer Neg Hx    I have reviewed his medical, social, and family history in detail and updated the electronic medical record as necessary.    PHYSICAL EXAMINATION  BP 110/80    Pulse 75    Ht '6\' 1"'  (1.854 m)    Wt 270 lb (122.5 kg)    BMI 35.62 kg/m  Wt Readings from Last 3 Encounters:  03/30/20 270 lb (122.5 kg)  04/12/19 276 lb (125.2 kg)  06/17/15 240 lb (108.9 kg)  GEN: NAD, appears stated age, doesn't appear chronically ill PSYCH: Cooperative, without pressured speech EYE: Conjunctivae pink, sclerae anicteric ENT: MMM, without oral ulcers CV: RR without R/Gs  RESP: CTAB posteriorly, without wheezing GI: NABS, soft, obese, rounded, ventral diastases present, NT, without rebound or guarding, no HSM appreciated MSK/EXT: No lower extremity edema SKIN: No jaundice NEURO:  Alert & Oriented x 3, no focal deficits   REVIEW OF DATA  I reviewed the following data at the time of this encounter:  GI Procedures and Studies  No relevant studies to  review  Laboratory Studies  Reviewed those in epic and care everywhere  Imaging Studies  No relevant studies to review   ASSESSMENT  [name] is a 40 y.o. male with a pmh significant for obesity, intermittent GERD.  The patient is seen today for evaluation and management of:  1. Abdominal pain, epigastric   2. Gastroesophageal reflux disease, unspecified whether esophagitis present   3. Belching symptom   4. Non-intractable vomiting with nausea, unspecified vomiting type   5. Change in bowel habits   6. Change in stool caliber    The patient is hemodynamically stable.  Clinically however he has a progressive upper abdominal discomfort with increased episodes of belching and GERD-like symptoms that are occurring on a more frequent basis at this time.  He has had intermittent changes in his bowel habits when he has episodes of significant belching of rotten eggs which can lead to nausea and vomiting.  Some of the symptoms sound to be GERD related although he is not experiencing overt  GERD symptoms/pyrosis on a daily basis it is becoming more frequent.  We will give the patient as needed antiemetics to have available if necessary.  He has also been prescribed a moderate dose Nexium PPI in effort of trying to get him started on treatment if he begins to have/experience this significant episode once again see if that may make any difference for the patient.  Diagnostic upper endoscopy with biopsies is recommended at this point in time for further evaluation as well as imaging of his abdomen to begin with an abdominal ultrasound.  Although the patient has had episodes of changes in his bowel habits and stool caliber and it only occurs at the time of these episodes and otherwise he is having normal bowel movements.  We did discuss the possibility of needing a colonoscopy but as his symptoms really are upper in origin we will begin with just an upper endoscopy though he understands that a colonoscopy may  be recommended at some point in time as well as cross-sectional imaging.  The risks and benefits of endoscopic evaluation were discussed with the patient; these include but are not limited to the risk of perforation, infection, bleeding, missed lesions, lack of diagnosis, severe illness requiring hospitalization, as well as anesthesia and sedation related illnesses.  The patient is agreeable to proceed.  All patient questions were answered to the best of my ability, and the patient agrees to the aforementioned plan of action with follow-up as indicated.   PLAN  Laboratories as outlined below Hepatitis C to be obtained (1 tattoo not done in normal sterile fashion per report and history) Abdominal ultrasound to be obtained Proceed with scheduling diagnostic endoscopy (esophageal/gastric/duodenal biopsies to be obtained) Zofran 4 mg every 8 hour as needed Nexium 40 mg daily (began if recurrence of symptoms and continue on a daily basis) Diagnostic colonoscopy on hold for now Cross-sectional imaging may be considered in future   Orders Placed This Encounter  Procedures   US Abdomen Complete   CBC   Comp Met (CMET)   Amylase   Lipase   TSH   Sedimentation rate   CRP High sensitivity   Tissue transglutaminase, IgA   Hepatitis C Antibody   IgA   Ambulatory referral to Gastroenterology    New Prescriptions   ESOMEPRAZOLE (NEXIUM) 40 MG CAPSULE    Take 1 capsule (40 mg total) by mouth daily.   ONDANSETRON (ZOFRAN ODT) 4 MG DISINTEGRATING TABLET    Take 1 tablet (4 mg total) by mouth every 8 (eight) hours as needed for nausea or vomiting.   Modified Medications   No medications on file    Planned Follow Up No follow-ups on file.   Total Time in Face-to-Face and in Coordination of Care for patient including independent/personal interpretation/review of prior testing, medical history, examination, medication adjustment, communicating results with the patient directly, and  documentation with the EHR is 35 minutes.   Justice Britain, MD [facility] Office # [phone]

## 2020-04-06 ENCOUNTER — Ambulatory Visit (HOSPITAL_COMMUNITY): Payer: BC Managed Care – PPO | Attending: Gastroenterology

## 2020-04-14 ENCOUNTER — Encounter: Payer: Self-pay | Admitting: Gastroenterology

## 2020-04-15 ENCOUNTER — Encounter: Payer: BC Managed Care – PPO | Admitting: Gastroenterology

## 2020-04-15 ENCOUNTER — Telehealth: Payer: Self-pay | Admitting: Gastroenterology

## 2020-04-15 NOTE — Telephone Encounter (Signed)
CALLED PATIENT AT 10:23 TO SEE IF HE WAS STILL COMING FOR HIS APPT. HE STATED HE WAS NOT COMING PROCEDURE WAS TO EXPENSE.

## 2020-04-15 NOTE — Telephone Encounter (Signed)
This was not ideal and he did not call to let us know either that he was not going to show up for his procedure either. I will not place a cancellation fee at this point. However, in the future if he is set up for a clinic visit or a procedure and cancels then late cancellation fee will need to be assessed. Thanks. GM

## 2020-07-14 ENCOUNTER — Ambulatory Visit
Admission: EM | Admit: 2020-07-14 | Discharge: 2020-07-14 | Disposition: A | Discharge status: Home / Self Care | Payer: BC Managed Care – PPO | Attending: Emergency Medicine | Admitting: Emergency Medicine

## 2020-07-14 ENCOUNTER — Encounter: Payer: Self-pay | Admitting: Emergency Medicine

## 2020-07-14 ENCOUNTER — Other Ambulatory Visit: Payer: Self-pay

## 2020-07-14 DIAGNOSIS — Z113 Encounter for screening for infections with a predominantly sexual mode of transmission: Secondary | ICD-10-CM | POA: Insufficient documentation

## 2020-07-14 DIAGNOSIS — Z202 Contact with and (suspected) exposure to infections with a predominantly sexual mode of transmission: Secondary | ICD-10-CM | POA: Insufficient documentation

## 2020-07-14 NOTE — ED Provider Notes (Signed)
[facility]   [id number] 07/14/20 Arrival Time: 1751   GM:WNUUVOZ FOR STD  SUBJECTIVE:  Paul Floyd is a 40 y.o. male who presents requesting STI screening.  Currently asymptomatic.  Partner recently diagnosed with trich. States he has been in a monogamous relationship with partner for the past year.  Admits to past history of STD.  Denies fever, chills, nausea, vomiting, abdominal or pelvic pain, penile rashes or lesions, testicular swelling or pain.      No LMP for male patient.  ROS: As per HPI.  All other pertinent ROS negative.     History reviewed. No pertinent past medical history. History reviewed. No pertinent surgical history. No Known Allergies No current facility-administered medications on file prior to encounter.   No current outpatient medications on file prior to encounter.   Social History   Socioeconomic History   Marital status: Single    Spouse name: Not on file   Number of children: Not on file   Years of education: Not on file   Highest education level: Not on file  Occupational History   Not on file  Tobacco Use   Smoking status: Some Days    Pack years: 0.00    Types: Cigarettes   Smokeless tobacco: Never  Substance and Sexual Activity   Alcohol use: No   Drug use: No   Sexual activity: Not on file  Other Topics Concern   Not on file  Social History Narrative   Not on file   Social Determinants of Health   Financial Resource Strain: Not on file  Food Insecurity: Not on file  Transportation Needs: Not on file  Physical Activity: Not on file  Stress: Not on file  Social Connections: Not on file  Intimate Partner Violence: Not on file   Family History  Problem Relation Age of Onset   Cancer Paternal Grandmother    Breast cancer Paternal Aunt    Colon cancer Neg Hx    Esophageal cancer Neg Hx    Inflammatory bowel disease Neg Hx    Liver disease Neg Hx    Pancreatic cancer Neg Hx    Rectal cancer Neg Hx    Stomach  cancer Neg Hx     OBJECTIVE:  Vitals:   07/14/20 1808  BP: (!) 143/93  Pulse: 82  Resp: 18  Temp: 98.1 F (36.7 C)  TempSrc: Tympanic  SpO2: 95%     General appearance: alert, NAD, appears stated age Head: NCAT Throat: lips, mucosa, and tongue normal; teeth and gums normal Lungs: CTA bilaterally without adventitious breath sounds Heart: regular rate and rhythm.   Abdomen: soft, non-tender; bowel sounds normal; no guarding GU: deferred Skin: warm and dry Psychological:  Alert and cooperative. Normal mood and affect.  LABS:  Labs Reviewed  CYTOLOGY, (ORAL, ANAL, URETHRAL) ANCILLARY ONLY    ASSESSMENT & PLAN:  1. Screening examination for STD (sexually transmitted disease)   2. STD exposure     No orders of the defined types were placed in this encounter.  Pending: Labs Reviewed  CYTOLOGY, (ORAL, ANAL, URETHRAL) ANCILLARY ONLY   Urethral swab obtained  Declines HIV/ syphilis testing today We will follow up with you regarding the results of your test If tests are positive, please abstain from sexual activity until you and your partner(s) are treated Follow up with PCP  Return here or go to ER if you have any new or worsening symptoms    Reviewed expectations re: course of current medical  issues. Questions answered. Outlined signs and symptoms indicating need for more acute intervention. Patient verbalized understanding. After Visit Summary given.        Rennis Harding, PA-C 07/14/20 1946

## 2020-07-14 NOTE — Discharge Instructions (Addendum)
Urethral swab obtained  Declines HIV/ syphilis testing today Take medications as prescribed and to completion We will follow up with you regarding the results of your test If tests are positive, please abstain from sexual activity until you and your partner(s) are treated Follow up with PCP  Return here or go to ER if you have any new or worsening symptoms

## 2020-07-14 NOTE — ED Triage Notes (Signed)
Sexual partner was diagnosed with STD and wants to be tested.

## 2020-07-15 ENCOUNTER — Telehealth (HOSPITAL_COMMUNITY): Payer: Self-pay | Admitting: Emergency Medicine

## 2020-07-15 LAB — CYTOLOGY, (ORAL, ANAL, URETHRAL) ANCILLARY ONLY
Chlamydia: NEGATIVE
Comment: NEGATIVE
Comment: NEGATIVE
Comment: NORMAL
Neisseria Gonorrhea: NEGATIVE
Trichomonas: POSITIVE — AB

## 2020-07-15 MED ORDER — METRONIDAZOLE 500 MG PO TABS: 500.0000 mg | ORAL_TABLET | Freq: Two times a day (BID) | ORAL | 0 refills | Status: AC

## 2022-01-12 ENCOUNTER — Ambulatory Visit: Payer: BC Managed Care – PPO | Admitting: Gastroenterology
